# Patient Record
Sex: Male | Born: 1975 | Race: White | Hispanic: No | Marital: Married | Smoking: Current every day smoker
Health system: Southern US, Community
[De-identification: ages and names within clinical notes are randomized; demographics above are authoritative.]

## PROBLEM LIST (undated history)

## (undated) DIAGNOSIS — Z973 Presence of spectacles and contact lenses: Secondary | ICD-10-CM

## (undated) DIAGNOSIS — F4310 Post-traumatic stress disorder, unspecified: Secondary | ICD-10-CM

## (undated) DIAGNOSIS — S43439A Superior glenoid labrum lesion of unspecified shoulder, initial encounter: Secondary | ICD-10-CM

## (undated) DIAGNOSIS — M7542 Impingement syndrome of left shoulder: Secondary | ICD-10-CM

## (undated) DIAGNOSIS — F1490 Cocaine use, unspecified, uncomplicated: Secondary | ICD-10-CM

## (undated) DIAGNOSIS — F411 Generalized anxiety disorder: Secondary | ICD-10-CM

## (undated) DIAGNOSIS — F603 Borderline personality disorder: Secondary | ICD-10-CM

## (undated) DIAGNOSIS — F514 Sleep terrors [night terrors]: Secondary | ICD-10-CM

## (undated) DIAGNOSIS — M1990 Unspecified osteoarthritis, unspecified site: Secondary | ICD-10-CM

## (undated) DIAGNOSIS — M25812 Other specified joint disorders, left shoulder: Secondary | ICD-10-CM

## (undated) DIAGNOSIS — F329 Major depressive disorder, single episode, unspecified: Secondary | ICD-10-CM

## (undated) HISTORY — PX: NO PAST SURGERIES: SHX2092

---

## 2018-10-25 DIAGNOSIS — Z87820 Personal history of traumatic brain injury: Secondary | ICD-10-CM

## 2018-10-25 HISTORY — DX: Personal history of traumatic brain injury: Z87.820

## 2019-04-08 ENCOUNTER — Other Ambulatory Visit: Payer: Self-pay

## 2019-04-08 ENCOUNTER — Emergency Department
Admission: EM | Admit: 2019-04-08 | Discharge: 2019-04-08 | Disposition: A | Discharge status: Home / Self Care | Payer: BC Managed Care – PPO | Attending: Emergency Medicine | Admitting: Emergency Medicine

## 2019-04-08 ENCOUNTER — Emergency Department: Payer: BC Managed Care – PPO

## 2019-04-08 DIAGNOSIS — S50811A Abrasion of right forearm, initial encounter: Secondary | ICD-10-CM | POA: Diagnosis not present

## 2019-04-08 DIAGNOSIS — S59811A Other specified injuries right forearm, initial encounter: Secondary | ICD-10-CM | POA: Diagnosis present

## 2019-04-08 DIAGNOSIS — S50812A Abrasion of left forearm, initial encounter: Secondary | ICD-10-CM | POA: Diagnosis not present

## 2019-04-08 DIAGNOSIS — Y9355 Activity, bike riding: Secondary | ICD-10-CM | POA: Diagnosis not present

## 2019-04-08 DIAGNOSIS — Y998 Other external cause status: Secondary | ICD-10-CM | POA: Insufficient documentation

## 2019-04-08 DIAGNOSIS — M25512 Pain in left shoulder: Secondary | ICD-10-CM | POA: Insufficient documentation

## 2019-04-08 DIAGNOSIS — Y929 Unspecified place or not applicable: Secondary | ICD-10-CM | POA: Insufficient documentation

## 2019-04-08 MED ORDER — CEPHALEXIN 500 MG PO CAPS: 500.0000 mg | ORAL_CAPSULE | Freq: Three times a day (TID) | ORAL | 0 refills | Status: AC
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
## 2021-05-27 ENCOUNTER — Encounter (HOSPITAL_BASED_OUTPATIENT_CLINIC_OR_DEPARTMENT_OTHER): Payer: Self-pay | Admitting: Orthopedic Surgery

## 2021-05-27 ENCOUNTER — Other Ambulatory Visit: Payer: Self-pay
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
## 2021-06-02 ENCOUNTER — Ambulatory Visit (HOSPITAL_BASED_OUTPATIENT_CLINIC_OR_DEPARTMENT_OTHER)
Admission: RE | Admit: 2021-06-02 | Discharge: 2021-06-02 | Disposition: A | Discharge status: Home / Self Care | Payer: No Typology Code available for payment source | Attending: Orthopedic Surgery | Admitting: Orthopedic Surgery

## 2021-06-02 ENCOUNTER — Ambulatory Visit (HOSPITAL_BASED_OUTPATIENT_CLINIC_OR_DEPARTMENT_OTHER): Payer: No Typology Code available for payment source | Admitting: Anesthesiology

## 2021-06-02 ENCOUNTER — Other Ambulatory Visit: Payer: Self-pay

## 2021-06-02 ENCOUNTER — Encounter (HOSPITAL_BASED_OUTPATIENT_CLINIC_OR_DEPARTMENT_OTHER)
Admission: RE | Disposition: A | Discharge status: Home / Self Care | Payer: Self-pay | Source: Home / Self Care | Attending: Orthopedic Surgery

## 2021-06-02 ENCOUNTER — Encounter (HOSPITAL_BASED_OUTPATIENT_CLINIC_OR_DEPARTMENT_OTHER): Payer: Self-pay | Admitting: Orthopedic Surgery

## 2021-06-02 DIAGNOSIS — X58XXXA Exposure to other specified factors, initial encounter: Secondary | ICD-10-CM | POA: Insufficient documentation

## 2021-06-02 DIAGNOSIS — Z882 Allergy status to sulfonamides status: Secondary | ICD-10-CM | POA: Diagnosis not present

## 2021-06-02 DIAGNOSIS — M25812 Other specified joint disorders, left shoulder: Secondary | ICD-10-CM | POA: Diagnosis not present

## 2021-06-02 DIAGNOSIS — M19012 Primary osteoarthritis, left shoulder: Secondary | ICD-10-CM | POA: Diagnosis not present

## 2021-06-02 DIAGNOSIS — F17210 Nicotine dependence, cigarettes, uncomplicated: Secondary | ICD-10-CM | POA: Insufficient documentation

## 2021-06-02 DIAGNOSIS — S43432A Superior glenoid labrum lesion of left shoulder, initial encounter: Secondary | ICD-10-CM | POA: Diagnosis present

## 2021-06-02 HISTORY — DX: Superior glenoid labrum lesion of unspecified shoulder, initial encounter: S43.439A

## 2021-06-02 HISTORY — DX: Generalized anxiety disorder: F41.1

## 2021-06-02 HISTORY — DX: Sleep terrors (night terrors): F51.4

## 2021-06-02 HISTORY — DX: Presence of spectacles and contact lenses: Z97.3

## 2021-06-02 HISTORY — DX: Other specified joint disorders, left shoulder: M25.812

## 2021-06-02 HISTORY — DX: Unspecified osteoarthritis, unspecified site: M19.90

## 2021-06-02 HISTORY — DX: Cocaine use, unspecified, uncomplicated: F14.90

## 2021-06-02 HISTORY — DX: Impingement syndrome of left shoulder: M75.42

## 2021-06-02 HISTORY — DX: Borderline personality disorder: F60.3

## 2021-06-02 HISTORY — DX: Major depressive disorder, single episode, unspecified: F32.9

## 2021-06-02 HISTORY — DX: Post-traumatic stress disorder, unspecified: F43.10

## 2021-06-02 HISTORY — PX: BICEPT TENODESIS: SHX5116

## 2021-06-02 LAB — RAPID URINE DRUG SCREEN, HOSP PERFORMED
Amphetamines: POSITIVE — AB
Barbiturates: NOT DETECTED
Benzodiazepines: NOT DETECTED
Cocaine: NOT DETECTED
Opiates: NOT DETECTED
Tetrahydrocannabinol: NOT DETECTED

## 2021-06-02 SURGERY — TENODESIS, BICEPS
Anesthesia: General | Site: Shoulder | Laterality: Left

## 2021-06-02 MED ORDER — LIDOCAINE HCL (PF) 2 % IJ SOLN
INTRAMUSCULAR | Status: AC
  Filled 2021-06-02: qty 5

## 2021-06-02 MED ORDER — DEXMEDETOMIDINE (PRECEDEX) IN NS 20 MCG/5ML (4 MCG/ML) IV SYRINGE
PREFILLED_SYRINGE | INTRAVENOUS | Status: AC
  Filled 2021-06-02: qty 5

## 2021-06-02 MED ORDER — ONDANSETRON HCL 4 MG/2ML IJ SOLN
INTRAMUSCULAR | Status: AC
  Filled 2021-06-02: qty 2

## 2021-06-02 MED ORDER — CEFAZOLIN SODIUM-DEXTROSE 2-4 GM/100ML-% IV SOLN
INTRAVENOUS | Status: AC
  Filled 2021-06-02: qty 100

## 2021-06-02 MED ORDER — ROCURONIUM BROMIDE 100 MG/10ML IV SOLN
INTRAVENOUS | Status: DC | PRN
  Administered 2021-06-02: 60 mg via INTRAVENOUS

## 2021-06-02 MED ORDER — EPINEPHRINE 1 MG/ML IJ SOLN
INTRAMUSCULAR | Status: DC | PRN
  Administered 2021-06-02: 1 mL

## 2021-06-02 MED ORDER — MIDAZOLAM HCL 2 MG/2ML IJ SOLN
INTRAMUSCULAR | Status: AC
  Filled 2021-06-02: qty 2

## 2021-06-02 MED ORDER — HYDROMORPHONE HCL 1 MG/ML IJ SOLN: 0.2500 mg | INTRAMUSCULAR | Status: DC | PRN

## 2021-06-02 MED ORDER — ROCURONIUM BROMIDE 10 MG/ML (PF) SYRINGE
PREFILLED_SYRINGE | INTRAVENOUS | Status: AC
  Filled 2021-06-02: qty 10

## 2021-06-02 MED ORDER — DEXAMETHASONE SODIUM PHOSPHATE 10 MG/ML IJ SOLN
INTRAMUSCULAR | Status: AC
  Filled 2021-06-02: qty 1

## 2021-06-02 MED ORDER — SUGAMMADEX SODIUM 200 MG/2ML IV SOLN
INTRAVENOUS | Status: DC | PRN
  Administered 2021-06-02: 200 mg via INTRAVENOUS

## 2021-06-02 MED ORDER — FENTANYL CITRATE (PF) 100 MCG/2ML IJ SOLN
25.0000 ug | INTRAMUSCULAR | Status: DC | PRN
  Administered 2021-06-02 (×2): 25 ug via INTRAVENOUS
  Administered 2021-06-02: 50 ug via INTRAVENOUS

## 2021-06-02 MED ORDER — BUPIVACAINE-EPINEPHRINE (PF) 0.25% -1:200000 IJ SOLN
INTRAMUSCULAR | Status: DC | PRN
  Administered 2021-06-02: 10 mL via PERINEURAL

## 2021-06-02 MED ORDER — FENTANYL CITRATE (PF) 100 MCG/2ML IJ SOLN
INTRAMUSCULAR | Status: AC
  Filled 2021-06-02: qty 2

## 2021-06-02 MED ORDER — DEXAMETHASONE SODIUM PHOSPHATE 4 MG/ML IJ SOLN
INTRAMUSCULAR | Status: DC | PRN
  Administered 2021-06-02: 8 mg via INTRAVENOUS

## 2021-06-02 MED ORDER — OXYCODONE HCL 5 MG PO TABS
5.0000 mg | ORAL_TABLET | Freq: Once | ORAL | Status: AC
Start: 2021-06-02 — End: 2021-06-02
  Administered 2021-06-02: 5 mg via ORAL

## 2021-06-02 MED ORDER — ACETAMINOPHEN 10 MG/ML IV SOLN
1000.0000 mg | Freq: Once | INTRAVENOUS | Status: AC
  Administered 2021-06-02: 1000 mg via INTRAVENOUS

## 2021-06-02 MED ORDER — MIDAZOLAM HCL 2 MG/2ML IJ SOLN
0.5000 mg | INTRAMUSCULAR | Status: DC | PRN
  Administered 2021-06-02: 1.5 mg via INTRAVENOUS
  Administered 2021-06-02: 0.5 mg via INTRAVENOUS

## 2021-06-02 MED ORDER — OXYCODONE HCL 5 MG PO TABS: 5.0000 mg | ORAL_TABLET | ORAL | 0 refills | Status: AC | PRN

## 2021-06-02 MED ORDER — ONDANSETRON 4 MG PO TBDP: 4.0000 mg | ORAL_TABLET | Freq: Three times a day (TID) | ORAL | 0 refills | Status: AC | PRN

## 2021-06-02 MED ORDER — ONDANSETRON HCL 4 MG/2ML IJ SOLN
INTRAMUSCULAR | Status: DC | PRN
  Administered 2021-06-02: 4 mg via INTRAVENOUS

## 2021-06-02 MED ORDER — ACETAMINOPHEN 10 MG/ML IV SOLN
INTRAVENOUS | Status: AC
  Filled 2021-06-02: qty 100

## 2021-06-02 MED ORDER — SODIUM CHLORIDE 0.9 % IR SOLN
Status: DC | PRN
  Administered 2021-06-02 (×2): 3000 mL

## 2021-06-02 MED ORDER — METHOCARBAMOL 500 MG PO TABS: 500.0000 mg | ORAL_TABLET | Freq: Four times a day (QID) | ORAL | 1 refills | Status: AC | PRN

## 2021-06-02 MED ORDER — LACTATED RINGERS IV SOLN: INTRAVENOUS | Status: DC

## 2021-06-02 MED ORDER — DEXMEDETOMIDINE (PRECEDEX) IN NS 20 MCG/5ML (4 MCG/ML) IV SYRINGE
PREFILLED_SYRINGE | INTRAVENOUS | Status: DC | PRN
  Administered 2021-06-02 (×2): 8 ug via INTRAVENOUS
  Administered 2021-06-02: 4 ug via INTRAVENOUS

## 2021-06-02 MED ORDER — CEFAZOLIN SODIUM-DEXTROSE 2-4 GM/100ML-% IV SOLN
2.0000 g | INTRAVENOUS | Status: AC
  Administered 2021-06-02: 2 g via INTRAVENOUS

## 2021-06-02 MED ORDER — OXYCODONE HCL 5 MG PO TABS
ORAL_TABLET | ORAL | Status: AC
  Filled 2021-06-02: qty 1

## 2021-06-02 MED ORDER — PROPOFOL 10 MG/ML IV BOLUS
INTRAVENOUS | Status: DC | PRN
  Administered 2021-06-02: 40 mg via INTRAVENOUS
  Administered 2021-06-02: 150 mg via INTRAVENOUS
  Administered 2021-06-02: 40 mg via INTRAVENOUS

## 2021-06-02 MED ORDER — LIDOCAINE HCL (CARDIAC) PF 100 MG/5ML IV SOSY
PREFILLED_SYRINGE | INTRAVENOUS | Status: DC | PRN
  Administered 2021-06-02: 70 mg via INTRAVENOUS

## 2021-06-02 MED ORDER — FENTANYL CITRATE (PF) 100 MCG/2ML IJ SOLN
INTRAMUSCULAR | Status: DC | PRN
  Administered 2021-06-02 (×2): 25 ug via INTRAVENOUS
  Administered 2021-06-02: 50 ug via INTRAVENOUS

## 2021-06-02 MED ORDER — PROPOFOL 10 MG/ML IV BOLUS
INTRAVENOUS | Status: AC
  Filled 2021-06-02: qty 20

## 2021-06-02 MED FILL — Sodium Chloride Irrigation Soln 0.9%: Qty: 3000 | Status: AC

## 2021-06-02 MED FILL — Epinephrine PF Inj 1 MG/ML: INTRAMUSCULAR | Qty: 1 | Status: AC

## 2021-06-02 SURGICAL SUPPLY — 77 items
BLADE SURG 11 STRL SS (BLADE) ×2 IMPLANT
BLADE SURG 15 STRL LF DISP TIS (BLADE) IMPLANT
BLADE SURG 15 STRL SS (BLADE)
BURR OVAL 8 FLU 4.0X13 (MISCELLANEOUS) ×2 IMPLANT
CANNULA 5.75X71 LONG (CANNULA) ×2 IMPLANT
CANNULA TWIST IN 8.25X7CM (CANNULA) ×2 IMPLANT
CONNECTOR 5 IN 1 STRAIGHT STRL (MISCELLANEOUS) ×2 IMPLANT
CUTTER BONE 4.0MM X 13CM (MISCELLANEOUS) ×2 IMPLANT
DISSECTOR  3.8MM X 13CM (MISCELLANEOUS) ×1
DISSECTOR 3.8MM X 13CM (MISCELLANEOUS) ×1 IMPLANT
DRAPE ORTHO SPLIT 77X108 STRL (DRAPES) ×4
DRAPE POUCH INSTRU U-SHP 10X18 (DRAPES) ×2 IMPLANT
DRAPE SHEET LG 3/4 BI-LAMINATE (DRAPES) ×2 IMPLANT
DRAPE STERI 35X30 U-POUCH (DRAPES) ×2 IMPLANT
DRAPE SURG 17X23 STRL (DRAPES) ×2 IMPLANT
DRAPE SURG ORHT 6 SPLT 77X108 (DRAPES) ×2 IMPLANT
DRAPE U-SHAPE 47X51 STRL (DRAPES) ×2 IMPLANT
DRSG EMULSION OIL 3X3 NADH (GAUZE/BANDAGES/DRESSINGS) IMPLANT
DRSG PAD ABDOMINAL 8X10 ST (GAUZE/BANDAGES/DRESSINGS) ×2 IMPLANT
DURAPREP 26ML APPLICATOR (WOUND CARE) ×2 IMPLANT
ELECT REM PT RETURN 9FT ADLT (ELECTROSURGICAL)
ELECTRODE REM PT RTRN 9FT ADLT (ELECTROSURGICAL) IMPLANT
FIBERSTICK 2 (SUTURE) IMPLANT
GAUZE 4X4 16PLY ~~LOC~~+RFID DBL (SPONGE) ×2 IMPLANT
GAUZE SPONGE 4X4 12PLY STRL (GAUZE/BANDAGES/DRESSINGS) ×2 IMPLANT
GLOVE SRG 8 PF TXTR STRL LF DI (GLOVE) ×2 IMPLANT
GLOVE SURG ENC MOIS LTX SZ7.5 (GLOVE) ×4 IMPLANT
GLOVE SURG UNDER POLY LF SZ8 (GLOVE) ×4
GOWN STRL REUS W/TWL XL LVL3 (GOWN DISPOSABLE) ×4 IMPLANT
IV NS IRRIG 3000ML ARTHROMATIC (IV SOLUTION) ×4 IMPLANT
KIT TURNOVER CYSTO (KITS) ×2 IMPLANT
LASSO SUT 90 DEGREE (SUTURE) IMPLANT
LOOP 2 FIBERLINK CLOSED (SUTURE) IMPLANT
MANIFOLD NEPTUNE II (INSTRUMENTS) ×2 IMPLANT
NDL SAFETY ECLIPSE 18X1.5 (NEEDLE) IMPLANT
NEEDLE FILTER BLUNT 18X 1/2SAF (NEEDLE) ×1
NEEDLE FILTER BLUNT 18X1 1/2 (NEEDLE) ×1 IMPLANT
NEEDLE HYPO 18GX1.5 SHARP (NEEDLE)
NEEDLE MAYO 6 CRC TAPER PT (NEEDLE) IMPLANT
NEEDLE MAYO CATGUT SZ4 (NEEDLE) IMPLANT
NEEDLE SCORPION MULTI FIRE (NEEDLE) IMPLANT
NS IRRIG 500ML POUR BTL (IV SOLUTION) ×2 IMPLANT
PACK ARTHROSCOPY DSU (CUSTOM PROCEDURE TRAY) ×2 IMPLANT
PACK BASIN DAY SURGERY FS (CUSTOM PROCEDURE TRAY) ×2 IMPLANT
PAD ARMBOARD 7.5X6 YLW CONV (MISCELLANEOUS) IMPLANT
PENCIL SMOKE EVACUATOR (MISCELLANEOUS) IMPLANT
PROBE APOLLO 90XL (SURGICAL WAND) ×2 IMPLANT
SLEEVE ARM SUSPENSION SYSTEM (MISCELLANEOUS) ×2 IMPLANT
SLING ARM FOAM STRAP MED (SOFTGOODS) ×2 IMPLANT
SLING S3 LATERAL DISP (MISCELLANEOUS) ×2 IMPLANT
SLING ULTRA II AB L (ORTHOPEDIC SUPPLIES) IMPLANT
SPONGE T-LAP 4X18 ~~LOC~~+RFID (SPONGE) IMPLANT
STRIP CLOSURE SKIN 1/2X4 (GAUZE/BANDAGES/DRESSINGS) ×2 IMPLANT
SUCTION FRAZIER HANDLE 10FR (MISCELLANEOUS)
SUCTION TUBE FRAZIER 10FR DISP (MISCELLANEOUS) IMPLANT
SUT 2 FIBERLOOP 20 STRT BLUE (SUTURE)
SUT ETHILON 3 0 PS 1 (SUTURE) IMPLANT
SUT FIBERWIRE #2 38 T-5 BLUE (SUTURE)
SUT LASSO 45 DEGREE LEFT (SUTURE) IMPLANT
SUT LASSO 45D RIGHT (SUTURE) IMPLANT
SUT MNCRL AB 3-0 PS2 27 (SUTURE) ×2 IMPLANT
SUT PDS AB 0 CT1 36 (SUTURE) IMPLANT
SUT TIGER TAPE 7 IN WHITE (SUTURE) IMPLANT
SUT VIC AB 0 CT1 36 (SUTURE) IMPLANT
SUT VIC AB 2-0 CT1 27 (SUTURE)
SUT VIC AB 2-0 CT1 TAPERPNT 27 (SUTURE) IMPLANT
SUTURE 2 FIBERLOOP 20 STRT BLU (SUTURE) IMPLANT
SUTURE FIBERWR #2 38 T-5 BLUE (SUTURE) IMPLANT
SYR CONTROL 10ML LL (SYRINGE) IMPLANT
SYR TB 1ML LL NO SAFETY (SYRINGE) IMPLANT
SYSTEM TENODESIS BC LNT 3.9 (Orthopedic Implant) ×2 IMPLANT
TAPE CLOTH SURG 6X10 WHT LF (GAUZE/BANDAGES/DRESSINGS) ×2 IMPLANT
TAPE HYPAFIX 6X30 (GAUZE/BANDAGES/DRESSINGS) ×2 IMPLANT
TOWEL OR 17X26 10 PK STRL BLUE (TOWEL DISPOSABLE) ×2 IMPLANT
TUBE CONNECTING 12X1/4 (SUCTIONS) ×4 IMPLANT
TUBING ARTHROSCOPY IRRIG 16FT (MISCELLANEOUS) ×2 IMPLANT
YANKAUER SUCT BULB TIP NO VENT (SUCTIONS) IMPLANT

## 2021-06-02 NOTE — Anesthesia Preprocedure Evaluation (Signed)
Anesthesia Evaluation  Patient identified by MRN, date of birth, ID band Patient awake    Reviewed: Allergy & Precautions, NPO status , Patient's Chart, lab work & pertinent test results  Airway Mallampati: II  TM Distance: >3 FB     Dental   Pulmonary Current Smoker and Patient abstained from smoking.,    breath sounds clear to auscultation       Cardiovascular negative cardio ROS   Rhythm:Regular Rate:Normal     Neuro/Psych PSYCHIATRIC DISORDERS Anxiety Depression    GI/Hepatic negative GI ROS, Neg liver ROS,   Endo/Other  negative endocrine ROS  Renal/GU negative Renal ROS     Musculoskeletal   Abdominal   Peds  Hematology   Anesthesia Other Findings   Reproductive/Obstetrics                             Anesthesia Physical Anesthesia Plan  ASA: 2  Anesthesia Plan: General   Post-op Pain Management:  Regional for Post-op pain   Induction: Intravenous  PONV Risk Score and Plan: 2 and Ondansetron, Propofol infusion and Midazolam  Airway Management Planned: Oral ETT  Additional Equipment:   Intra-op Plan:   Post-operative Plan: Extubation in OR  Informed Consent: I have reviewed the patients History and Physical, chart, labs and discussed the procedure including the risks, benefits and alternatives for the proposed anesthesia with the patient or authorized representative who has indicated his/her understanding and acceptance.     Dental advisory given  Plan Discussed with: CRNA and Anesthesiologist  Anesthesia Plan Comments:         Anesthesia Quick Evaluation

## 2021-06-02 NOTE — Discharge Instructions (Addendum)
-  Maintain your arm in sling at all times.  You may begin moving the shoulder with the elbow flexed starting on postoperative day 2 once your nerve block is worn off.  No lifting over 2 pounds with the left arm.  Again, he should keep your elbow slightly flexed moving the shoulder.  You will be instructed on more formal exercises when she began your outpatient therapy.  -Maintain postoperative bandages for 3 days.  He may remove on the third day and begin showering at that time.  Do not submerge underwater.  -Apply ice to the left shoulder for 20 to 30 minutes out of each hour you are awake.  She do this around-the-clock.  -For mild to moderate pain use Tylenol and Advil around-the-clock.  For any breakthrough pain use oxycodone as necessary.  -Follow-up with Dr. Aundria Rud in 2 weeks.    Post Anesthesia Home Care Instructions  Activity: Get plenty of rest for the remainder of the day. A responsible individual must stay with you for 24 hours following the procedure.  For the next 24 hours, DO NOT: -Drive a car -Advertising copywriter -Drink alcoholic beverages -Take any medication unless instructed by your physician -Make any legal decisions or sign important papers.  Meals: Start with liquid foods such as gelatin or soup. Progress to regular foods as tolerated. Avoid greasy, spicy, heavy foods. If nausea and/or vomiting occur, drink only clear liquids until the nausea and/or vomiting subsides. Call your physician if vomiting continues.  Special Instructions/Symptoms: Your throat may feel dry or sore from the anesthesia or the breathing tube placed in your throat during surgery. If this causes discomfort, gargle with warm salt water. The discomfort should disappear within 24 hours.

## 2021-06-02 NOTE — Anesthesia Procedure Notes (Addendum)
Anesthesia Regional Block: Interscalene brachial plexus block   Pre-Anesthetic Checklist: , timeout performed,  Correct Patient, Correct Site, Correct Laterality,  Correct Procedure, Correct Position, site marked,  Risks and benefits discussed,  Surgical consent,  Pre-op evaluation,  At surgeon's request and post-op pain management  Laterality: Left  Prep: chloraprep       Needles:  Injection technique: Single-shot  Needle Type: Stimulator Needle - 40          Additional Needles:   Procedures: Doppler guided,,,, ultrasound used (permanent image in chart),,     Nerve Stimulator or Paresthesia:  Response: 0.5 mA  Additional Responses:   Narrative:  Start time: 06/02/2021 12:50 PM End time: 06/02/2021 1:15 PM Injection made incrementally with aspirations every 5 mL.  Performed by: Personally  Anesthesiologist: Dorris Singh, MD

## 2021-06-02 NOTE — Transfer of Care (Signed)
Immediate Anesthesia Transfer of Care Note  Patient: Cory Phelps Dec  Procedure(s) Performed: Left shoulder arthroscopic biceps tenodesis,subacromial decompression and distal clavicle resection (Left: Shoulder)  Patient Location: PACU  Anesthesia Type:General  Level of Consciousness: awake, alert , oriented and patient cooperative  Airway & Oxygen Therapy: Patient Spontanous Breathing and Patient connected to nasal cannula oxygen  Post-op Assessment: Report given to RN and Post -op Vital signs reviewed and stable  Post vital signs: Reviewed and stable  Last Vitals:  Vitals Value Taken Time  BP 118/83 06/02/21 1503  Temp    Pulse 73 06/02/21 1507  Resp 21 06/02/21 1507  SpO2 100 % 06/02/21 1507  Vitals shown include unvalidated device data.  Last Pain:  Vitals:   06/02/21 1158  TempSrc: Oral  PainSc: 2       Patients Stated Pain Goal: 6 (06/02/21 1158)  Complications: No notable events documented.

## 2021-06-02 NOTE — Anesthesia Procedure Notes (Signed)
Procedure Name: Intubation Date/Time: 06/02/2021 1:34 PM Performed by: Suan Halter, CRNA Pre-anesthesia Checklist: Patient identified, Emergency Drugs available, Suction available, Patient being monitored and Timeout performed Patient Re-evaluated:Patient Re-evaluated prior to induction Oxygen Delivery Method: Circle system utilized Preoxygenation: Pre-oxygenation with 100% oxygen Induction Type: IV induction Ventilation: Mask ventilation without difficulty Laryngoscope Size: Mac and 4 Grade View: Grade II Tube type: Oral Tube size: 7.5 mm Number of attempts: 1 Airway Equipment and Method: Stylet Placement Confirmation: ETT inserted through vocal cords under direct vision, positive ETCO2, CO2 detector and breath sounds checked- equal and bilateral Secured at: 23 cm Tube secured with: Tape Dental Injury: Teeth and Oropharynx as per pre-operative assessment
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
## 2021-06-02 NOTE — H&P (Signed)
ORTHOPAEDIC H and P  REQUESTING PHYSICIAN: Yolonda Kida, MD  PCP:  Patient, No Pcp Per (Inactive)  Chief Complaint: Left shoulder pain  HPI: Cory Phelps is a 45 y.o. male who complains of left shoulder pain following a work injury approximately 6 months or so ago.  He has had conservative treatment to this point.  He has had some difficulty getting back to his baseline level of function.  He has asymptomatic superior labrum tear with proximal biceps tendinitis as well as symptomatic AC joint arthropathy.  He is here today for arthroscopic management of both.  No new complaints.  Past Medical History:  Diagnosis Date   Borderline personality disorder (HCC)    Cocaine use    05-27-2021  per pt last used approx. 05-20-2021   Explosive personality disorder (HCC)    GAD (generalized anxiety disorder)    History of concussion 2020   per pt brief LOC ,  residual post-concussion syndrome,  pt stated this resolved same year with no issues since   Labral tear of shoulder    MDD (major depressive disorder)    Night terrors    OA (osteoarthritis)    left shoulder AC joint   PTSD (post-traumatic stress disorder)    Shoulder impingement, left    Wears contact lenses    Past Surgical History:  Procedure Laterality Date   NO PAST SURGERIES     Social History   Socioeconomic History   Marital status: Married    Spouse name: Not on file   Number of children: Not on file   Years of education: Not on file   Highest education level: Not on file  Occupational History   Not on file  Tobacco Use   Smoking status: Every Day    Packs/day: 1.50    Years: 22.00    Pack years: 33.00    Types: Cigarettes   Smokeless tobacco: Never  Vaping Use   Vaping Use: Never used  Substance and Sexual Activity   Alcohol use: Not Currently   Drug use: Yes    Types: Cocaine    Comment: 06-02-2021  per pt last used cocaine approx a couple days ago   Sexual activity: Not on file  Other  Topics Concern   Not on file  Social History Narrative   Not on file   Social Determinants of Health   Financial Resource Strain: Not on file  Food Insecurity: Not on file  Transportation Needs: Not on file  Physical Activity: Not on file  Stress: Not on file  Social Connections: Not on file   History reviewed. No pertinent family history. Allergies  Allergen Reactions   Shellfish Allergy Swelling and Cough   Sulfa Antibiotics Swelling    And Severe headache   Prior to Admission medications   Medication Sig Start Date End Date Taking? Authorizing Provider  DULoxetine (CYMBALTA) 30 MG capsule Take 30 mg by mouth daily.   Yes [provider]  DULoxetine (CYMBALTA) 60 MG capsule Take 60 mg by mouth daily.   Yes [provider]   No results found.  Positive ROS: All other systems have been reviewed and were otherwise negative with the exception of those mentioned in the HPI and as above.  Physical Exam: General: Alert, no acute distress Cardiovascular: No pedal edema Respiratory: No cyanosis, no use of accessory musculature GI: No organomegaly, abdomen is soft and non-tender Skin: No lesions in the area of chief complaint Neurologic: Sensation intact distally Psychiatric:  Patient is competent for consent with normal mood and affect Lymphatic: No axillary or cervical lymphadenopathy  MUSCULOSKELETAL:  Left upper extremity is warm and well-perfused.  No open wounds or lesions.  Assessment: 1.  Left shoulder proximal biceps tendinitis 2.  Left shoulder superior labrum tear 3.  Left shoulder acromioclavicular arthropathy  Plan: -Plan to go ahead today with arthroscopic management of the above diagnoses.  We again reviewed the risk of bleeding, infection, damage to surrounding nerves and vessels, stiffness, failure of pain relief, need for further surgery, as well as risk of anesthesia.  He has provided informed consent.  -Plan for discharge home  postoperatively from PACU.    Yolonda Kida, MD Cell [phone]    06/02/2021 12:37 PM

## 2021-06-02 NOTE — Brief Op Note (Signed)
06/02/2021  2:37 PM  PATIENT:  Arleta Creek  45 y.o. male  PRE-OPERATIVE DIAGNOSIS:  left shoulder superior labrum anterior to posterior tear, Impingment, acromioclavicular osteoarthritis  POST-OPERATIVE DIAGNOSIS:  left shoulder superior labrum anterior to posterior tear, Impingment, acromioclavicular osteoarthritis  PROCEDURE:  Procedure(s): Left shoulder arthroscopic biceps tenodesis,subacromial decompression and distal clavicle resection (Left)  SURGEON:  Surgeon(s) and Role:    * Yolonda Kida, MD - Primary  PHYSICIAN ASSISTANT: Dion Saucier, PA-C  ANESTHESIA:   local, regional, and general  EBL:  10 cc  BLOOD ADMINISTERED:none  DRAINS: none   LOCAL MEDICATIONS USED:  MARCAINE     SPECIMEN:  No Specimen  DISPOSITION OF SPECIMEN:  N/A  COUNTS:  YES  TOURNIQUET:  * No tourniquets in log *  DICTATION: .Note written in EPIC  PLAN OF CARE: Discharge to home after PACU  PATIENT DISPOSITION:  PACU - hemodynamically stable.   Delay start of Pharmacological VTE agent (>24hrs) due to surgical blood loss or risk of bleeding: not applicable

## 2021-06-02 NOTE — Progress Notes (Signed)
Assisted Dr. Green with left, ultrasound guided, interscalene  block. Side rails up, monitors on throughout procedure. See vital signs in flow sheet. Tolerated Procedure well. 

## 2021-06-03 ENCOUNTER — Encounter (HOSPITAL_BASED_OUTPATIENT_CLINIC_OR_DEPARTMENT_OTHER): Payer: Self-pay | Admitting: Orthopedic Surgery

## 2021-06-04 NOTE — Anesthesia Postprocedure Evaluation (Signed)
Anesthesia Post Note  Patient: Cory Phelps  Procedure(s) Performed: Left shoulder arthroscopic biceps tenodesis,subacromial decompression and distal clavicle resection (Left: Shoulder)     Patient location during evaluation: PACU Anesthesia Type: General Level of consciousness: awake Pain management: pain level controlled Vital Signs Assessment: post-procedure vital signs reviewed and stable Respiratory status: spontaneous breathing Cardiovascular status: stable Postop Assessment: no apparent nausea or vomiting Anesthetic complications: no   No notable events documented.  Last Vitals:  Vitals:   06/02/21 1650 06/02/21 1704  BP: 115/80 115/80  Pulse: 78 78  Resp: 16 16  Temp: 36.4 C   SpO2: 99% 99%    Last Pain:  Vitals:   06/02/21 1704  TempSrc: Oral  PainSc:                  Ikram Riebe
